# Patient Record
Sex: Male | Born: 2000 | Race: Black or African American | Hispanic: No | Marital: Single | Smoking: Current every day smoker
Health system: Southern US, Community
[De-identification: ages and names within clinical notes are randomized; demographics above are authoritative.]

## PROBLEM LIST (undated history)

## (undated) HISTORY — PX: ADENOIDECTOMY: SHX5191

---
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
## 2020-01-15 ENCOUNTER — Ambulatory Visit: Payer: Self-pay

## 2020-01-15 ENCOUNTER — Other Ambulatory Visit: Payer: Self-pay

## 2020-01-15 ENCOUNTER — Other Ambulatory Visit: Payer: Self-pay | Admitting: Family Medicine

## 2020-01-15 DIAGNOSIS — M79641 Pain in right hand: Secondary | ICD-10-CM

## 2020-08-06 ENCOUNTER — Other Ambulatory Visit: Payer: Self-pay

## 2020-08-06 ENCOUNTER — Encounter (HOSPITAL_BASED_OUTPATIENT_CLINIC_OR_DEPARTMENT_OTHER): Payer: Self-pay | Admitting: *Deleted

## 2020-08-06 ENCOUNTER — Emergency Department (HOSPITAL_BASED_OUTPATIENT_CLINIC_OR_DEPARTMENT_OTHER)
Admission: EM | Admit: 2020-08-06 | Discharge: 2020-08-06 | Disposition: A | Discharge status: Home / Self Care | Payer: Worker's Compensation | Attending: Emergency Medicine | Admitting: Emergency Medicine

## 2020-08-06 DIAGNOSIS — W228XXA Striking against or struck by other objects, initial encounter: Secondary | ICD-10-CM | POA: Insufficient documentation

## 2020-08-06 DIAGNOSIS — F17290 Nicotine dependence, other tobacco product, uncomplicated: Secondary | ICD-10-CM | POA: Insufficient documentation

## 2020-08-06 DIAGNOSIS — Y990 Civilian activity done for income or pay: Secondary | ICD-10-CM | POA: Insufficient documentation

## 2020-08-06 DIAGNOSIS — S3991XA Unspecified injury of abdomen, initial encounter: Secondary | ICD-10-CM | POA: Diagnosis not present

## 2020-08-06 LAB — URINALYSIS, ROUTINE W REFLEX MICROSCOPIC
Bilirubin Urine: NEGATIVE
Glucose, UA: NEGATIVE mg/dL
Hgb urine dipstick: NEGATIVE
Ketones, ur: NEGATIVE mg/dL
Leukocytes,Ua: NEGATIVE
Nitrite: NEGATIVE
Protein, ur: NEGATIVE mg/dL
Specific Gravity, Urine: 1.015 (ref 1.005–1.030)
pH: 6.5 (ref 5.0–8.0)
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
## 2020-08-06 NOTE — Discharge Instructions (Signed)
Take 4 over the counter ibuprofen tablets 3 times a day or 2 over-the-counter naproxen tablets twice a day for pain. Also take tylenol 1000mg(2 extra strength) four times a day.    

## 2021-01-21 ENCOUNTER — Other Ambulatory Visit: Payer: Self-pay

## 2021-01-21 ENCOUNTER — Encounter (HOSPITAL_BASED_OUTPATIENT_CLINIC_OR_DEPARTMENT_OTHER): Payer: Self-pay | Admitting: Urology

## 2021-01-21 ENCOUNTER — Emergency Department (HOSPITAL_BASED_OUTPATIENT_CLINIC_OR_DEPARTMENT_OTHER)
Admission: EM | Admit: 2021-01-21 | Discharge: 2021-01-22 | Disposition: A | Discharge status: Home / Self Care | Payer: 59 | Attending: Emergency Medicine | Admitting: Emergency Medicine

## 2021-01-21 DIAGNOSIS — F17290 Nicotine dependence, other tobacco product, uncomplicated: Secondary | ICD-10-CM | POA: Insufficient documentation

## 2021-01-21 DIAGNOSIS — U071 COVID-19: Secondary | ICD-10-CM

## 2021-01-21 DIAGNOSIS — R509 Fever, unspecified: Secondary | ICD-10-CM | POA: Diagnosis present

## 2021-01-21 DIAGNOSIS — R112 Nausea with vomiting, unspecified: Secondary | ICD-10-CM

## 2021-01-21 NOTE — ED Triage Notes (Signed)
This morning, started having body aches, n/v x7 after eating lunch, Fever at home of 102.

## 2021-01-22 LAB — RESP PANEL BY RT-PCR (FLU A&B, COVID) ARPGX2
Influenza A by PCR: NEGATIVE
Influenza B by PCR: NEGATIVE
SARS Coronavirus 2 by RT PCR: POSITIVE — AB

## 2021-01-22 MED ORDER — NAPROXEN 375 MG PO TABS: ORAL_TABLET | ORAL | 0 refills | Status: AC

## 2021-01-22 MED ORDER — SODIUM CHLORIDE 0.9 % IV BOLUS
1000.0000 mL | Freq: Once | INTRAVENOUS | Status: AC
  Administered 2021-01-22: 1000 mL via INTRAVENOUS

## 2021-01-22 MED ORDER — ONDANSETRON HCL 4 MG/2ML IJ SOLN
4.0000 mg | Freq: Once | INTRAMUSCULAR | Status: AC
  Administered 2021-01-22: 4 mg via INTRAVENOUS
  Filled 2021-01-22: qty 2

## 2021-01-22 MED ORDER — ONDANSETRON 8 MG PO TBDP: 8.0000 mg | ORAL_TABLET | Freq: Three times a day (TID) | ORAL | 0 refills | Status: AC | PRN

## 2021-01-22 MED ORDER — KETOROLAC TROMETHAMINE 15 MG/ML IJ SOLN
15.0000 mg | Freq: Once | INTRAMUSCULAR | Status: AC
  Administered 2021-01-22: 15 mg via INTRAVENOUS
  Filled 2021-01-22: qty 1

## 2021-01-22 NOTE — ED Provider Notes (Signed)
[facility] Provider Note: Troy Dell, MD, FACEP  CSN: [id number] MRN: [id number] ARRIVAL: 01/21/21 at 2311 ROOM: [facility]01/[facility]01   CHIEF COMPLAINT  Generalized Body Aches   HISTORY OF PRESENT ILLNESS  01/22/21 12:21 AM Troy Boone is a 20 y.o. male who developed generalized body aches yesterday morning about 9 AM.  The aches are most prominent in his back and he rates this is an 8 out of 10.  He is also had a fever to 101 which improved with Tylenol.  There was some improvement in his body aches but his back still hurts him.  His last dose of Tylenol was several hours ago.  After eating lunch yesterday afternoon he developed nausea and vomiting and estimates he vomited about 7 times.  He continues to feel nauseated.  He has not had any diarrhea with this.  He denies sore throat, nasal congestion, cough, shortness of breath, abdominal pain or diarrhea.  He has had some chills with this.  His temperature on arrival was 100.2 and was just rechecked at 100.9.   History reviewed. No pertinent past medical history.  Past Surgical History:  Procedure Laterality Date  . ADENOIDECTOMY     10 years    History reviewed. No pertinent family history.  Social History   Tobacco Use  . Smoking status: Current Every Day Smoker    Types: E-cigarettes  . Smokeless tobacco: Former Neurosurgeon    Types: Engineer, drilling  . Vaping Use: Every day  Substance Use Topics  . Alcohol use: Never  . Drug use: Never    Prior to Admission medications   Medication Sig Start Date End Date Taking? Authorizing Provider  naproxen (NAPROSYN) 375 MG tablet Take 1 tablet twice daily as needed for body aches or fever. 01/22/21  Yes Quintina Hakeem, MD  ondansetron (ZOFRAN ODT) 8 MG disintegrating tablet Take 1 tablet (8 mg total) by mouth every 8 (eight) hours as needed for nausea or vomiting. 01/22/21  Yes Shandricka Monroy, MD    Allergies Patient has no known allergies.   REVIEW OF SYSTEMS  Negative except  as noted here or in the History of Present Illness.   PHYSICAL EXAMINATION  Initial Vital Signs Blood pressure (!) 150/83, pulse (!) 128, temperature 100.2 F (37.9 C), temperature source Oral, resp. rate 18, height 5\' 7"  (1.702 m), weight 70.3 kg, SpO2 97 %.  Examination General: Well-developed, well-nourished male in no acute distress; appearance consistent with age of record HENT: normocephalic; atraumatic; no pharyngeal erythema Eyes: pupils equal, round and reactive to light; extraocular muscles intact Neck: supple Heart: regular rate and rhythm; tachycardia Lungs: clear to auscultation bilaterally Abdomen: soft; nondistended; nontender; bowel sounds present Extremities: No deformity; full range of motion; pulses normal Neurologic: Awake, alert and oriented; motor function intact in all extremities and symmetric; no facial droop Skin: Warm and dry Psychiatric: Normal mood and affect   RESULTS  Summary of this visit's results, reviewed and interpreted by myself:   EKG Interpretation  Date/Time:    Ventricular Rate:    PR Interval:    QRS Duration:   QT Interval:    QTC Calculation:   R Axis:     Text Interpretation:        Laboratory Studies: Results for orders placed or performed during the hospital encounter of 01/21/21 (from the past 24 hour(s))  Resp Panel by RT-PCR (Flu A&B, Covid) Nasopharyngeal Swab     Status: Abnormal   Collection Time: 01/22/21 12:31  AM   Specimen: Nasopharyngeal Swab; Nasopharyngeal(NP) swabs in vial transport medium  Result Value Ref Range   SARS Coronavirus 2 by RT PCR POSITIVE (A) NEGATIVE   Influenza A by PCR NEGATIVE NEGATIVE   Influenza B by PCR NEGATIVE NEGATIVE   Imaging Studies: No results found.  ED COURSE and MDM  Nursing notes, initial and subsequent vitals signs, including pulse oximetry, reviewed and interpreted by myself.  Vitals:   01/21/21 2320 01/21/21 2322  BP:  (!) 150/83  Pulse:  (!) 128  Resp:  18  Temp:   100.2 F (37.9 C)  TempSrc:  Oral  SpO2:  97%  Weight: 70.3 kg   Height: 5\' 7"  (1.702 m)    Medications  sodium chloride 0.9 % bolus 1,000 mL (0 mLs Intravenous Stopped 01/22/21 0137)  ondansetron (ZOFRAN) injection 4 mg (4 mg Intravenous Given 01/22/21 0036)  ketorolac (TORADOL) 15 MG/ML injection 15 mg (15 mg Intravenous Given 01/22/21 0036)   1:56 AM Patient feeling better after IV fluids and medications.  Nausea has resolved.  Patient positive for COVID.  He does not fit in a high risk group that would qualify him for Paxlovid.   PROCEDURES  Procedures   ED DIAGNOSES     ICD-10-CM   1. COVID-19 virus infection  U07.1   2. Nausea and vomiting in adult  R11.2        Cheyan Frees, MD 01/22/21 0200

## 2021-06-28 ENCOUNTER — Emergency Department (HOSPITAL_COMMUNITY)
Admission: EM | Admit: 2021-06-28 | Discharge: 2021-06-28 | Disposition: A | Discharge status: Home / Self Care | Payer: BC Managed Care – PPO | Attending: Emergency Medicine | Admitting: Emergency Medicine

## 2021-06-28 ENCOUNTER — Encounter (HOSPITAL_COMMUNITY): Payer: Self-pay | Admitting: Emergency Medicine

## 2021-06-28 ENCOUNTER — Emergency Department (HOSPITAL_COMMUNITY): Payer: BC Managed Care – PPO

## 2021-06-28 ENCOUNTER — Other Ambulatory Visit: Payer: Self-pay

## 2021-06-28 DIAGNOSIS — S20219A Contusion of unspecified front wall of thorax, initial encounter: Secondary | ICD-10-CM | POA: Insufficient documentation

## 2021-06-28 DIAGNOSIS — F17210 Nicotine dependence, cigarettes, uncomplicated: Secondary | ICD-10-CM | POA: Insufficient documentation

## 2021-06-28 DIAGNOSIS — M25512 Pain in left shoulder: Secondary | ICD-10-CM | POA: Diagnosis not present

## 2021-06-28 DIAGNOSIS — S299XXA Unspecified injury of thorax, initial encounter: Secondary | ICD-10-CM | POA: Diagnosis present

## 2021-06-28 DIAGNOSIS — S161XXA Strain of muscle, fascia and tendon at neck level, initial encounter: Secondary | ICD-10-CM | POA: Diagnosis not present

## 2021-06-28 DIAGNOSIS — Y92410 Unspecified street and highway as the place of occurrence of the external cause: Secondary | ICD-10-CM | POA: Diagnosis not present

## 2021-06-28 MED ORDER — IBUPROFEN 800 MG PO TABS: 800.0000 mg | ORAL_TABLET | Freq: Three times a day (TID) | ORAL | 0 refills | Status: AC | PRN
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
## 2021-06-28 NOTE — Discharge Instructions (Signed)
Follow-up with your family doctor if any problems.  You need to get your blood pressure checked in the next week and make sure it is returned back down to normal
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
